# Patient Record
Sex: Male | Born: 1985 | Race: Black or African American | Hispanic: No | Marital: Married
Health system: Southern US, Community
[De-identification: ages and names within clinical notes are randomized; demographics above are authoritative.]

---

## 2020-06-23 ENCOUNTER — Emergency Department (HOSPITAL_COMMUNITY): Payer: BC Managed Care – PPO

## 2020-06-23 ENCOUNTER — Encounter (HOSPITAL_COMMUNITY): Payer: Self-pay | Admitting: Physician Assistant

## 2020-06-23 ENCOUNTER — Other Ambulatory Visit: Payer: Self-pay

## 2020-06-23 ENCOUNTER — Emergency Department (HOSPITAL_COMMUNITY)
Admission: EM | Admit: 2020-06-23 | Discharge: 2020-06-23 | Disposition: A | Discharge status: Home / Self Care | Payer: BC Managed Care – PPO | Attending: Emergency Medicine | Admitting: Emergency Medicine

## 2020-06-23 DIAGNOSIS — M545 Low back pain, unspecified: Secondary | ICD-10-CM

## 2020-06-23 DIAGNOSIS — Y939 Activity, unspecified: Secondary | ICD-10-CM | POA: Insufficient documentation

## 2020-06-23 DIAGNOSIS — Y92512 Supermarket, store or market as the place of occurrence of the external cause: Secondary | ICD-10-CM | POA: Insufficient documentation

## 2020-06-23 DIAGNOSIS — M79605 Pain in left leg: Secondary | ICD-10-CM

## 2020-06-23 DIAGNOSIS — Y999 Unspecified external cause status: Secondary | ICD-10-CM | POA: Diagnosis not present

## 2020-06-23 MED ORDER — NAPROXEN 375 MG PO TABS: 375.0000 mg | ORAL_TABLET | Freq: Two times a day (BID) | ORAL | 0 refills | Status: AC

## 2020-06-23 MED ORDER — OXYCODONE-ACETAMINOPHEN 5-325 MG PO TABS
1.0000 | ORAL_TABLET | Freq: Once | ORAL | Status: AC
  Administered 2020-06-23: 1 via ORAL
  Filled 2020-06-23: qty 1
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
## 2020-06-23 NOTE — Discharge Instructions (Addendum)
Please follow up with your primary care provider within 5-7 days for re-evaluation of your symptoms. If you do not have a primary care provider, information for a healthcare clinic has been provided for you to make arrangements for follow up care. Please return to the emergency department for any new or worsening symptoms. ° °
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
